# Patient Record
Sex: Female | Born: 1970 | Race: Black or African American | Hispanic: No | Marital: Single | Smoking: Never smoker
Health system: Southern US, Community
[De-identification: ages and names within clinical notes are randomized; demographics above are authoritative.]

## PROBLEM LIST (undated history)

## (undated) DIAGNOSIS — Z85850 Personal history of malignant neoplasm of thyroid: Secondary | ICD-10-CM

## (undated) DIAGNOSIS — D649 Anemia, unspecified: Secondary | ICD-10-CM

## (undated) DIAGNOSIS — Z9289 Personal history of other medical treatment: Secondary | ICD-10-CM

## (undated) DIAGNOSIS — E785 Hyperlipidemia, unspecified: Secondary | ICD-10-CM

## (undated) DIAGNOSIS — C801 Malignant (primary) neoplasm, unspecified: Secondary | ICD-10-CM

## (undated) DIAGNOSIS — C73 Malignant neoplasm of thyroid gland: Secondary | ICD-10-CM

## (undated) DIAGNOSIS — I10 Essential (primary) hypertension: Secondary | ICD-10-CM

## (undated) DIAGNOSIS — N915 Oligomenorrhea, unspecified: Secondary | ICD-10-CM

## (undated) DIAGNOSIS — L309 Dermatitis, unspecified: Secondary | ICD-10-CM

## (undated) DIAGNOSIS — E079 Disorder of thyroid, unspecified: Secondary | ICD-10-CM

## (undated) DIAGNOSIS — N912 Amenorrhea, unspecified: Secondary | ICD-10-CM

## (undated) DIAGNOSIS — E119 Type 2 diabetes mellitus without complications: Secondary | ICD-10-CM

## (undated) HISTORY — DX: Amenorrhea, unspecified: N91.2

## (undated) HISTORY — DX: Malignant neoplasm of thyroid gland: C73

## (undated) HISTORY — DX: Anemia, unspecified: D64.9

## (undated) HISTORY — DX: Hyperlipidemia, unspecified: E78.5

## (undated) HISTORY — DX: Personal history of other medical treatment: Z92.89

## (undated) HISTORY — DX: Dermatitis, unspecified: L30.9

## (undated) HISTORY — DX: Disorder of thyroid, unspecified: E07.9

## (undated) HISTORY — DX: Personal history of malignant neoplasm of thyroid: Z85.850

## (undated) HISTORY — DX: Essential (primary) hypertension: I10

## (undated) HISTORY — DX: Oligomenorrhea, unspecified: N91.5

## (undated) HISTORY — DX: Type 2 diabetes mellitus without complications: E11.9

---

## 1989-03-01 HISTORY — PX: KNEE ARTHROSCOPY: SUR90

## 2007-03-02 DIAGNOSIS — Z85850 Personal history of malignant neoplasm of thyroid: Secondary | ICD-10-CM

## 2007-03-02 HISTORY — DX: Personal history of malignant neoplasm of thyroid: Z85.850

## 2007-03-31 ENCOUNTER — Ambulatory Visit: Payer: Self-pay | Admitting: Family Medicine

## 2007-04-13 ENCOUNTER — Ambulatory Visit: Payer: Self-pay | Admitting: Family Medicine

## 2007-08-30 ENCOUNTER — Ambulatory Visit: Payer: Self-pay | Admitting: Oncology

## 2007-09-04 ENCOUNTER — Other Ambulatory Visit: Payer: Self-pay

## 2007-09-04 ENCOUNTER — Ambulatory Visit: Payer: Self-pay | Admitting: Unknown Physician Specialty

## 2007-09-05 ENCOUNTER — Ambulatory Visit: Payer: Self-pay | Admitting: Unknown Physician Specialty

## 2007-09-05 HISTORY — PX: THYROIDECTOMY: SHX17

## 2007-09-16 DIAGNOSIS — Z9289 Personal history of other medical treatment: Secondary | ICD-10-CM

## 2007-09-16 HISTORY — DX: Personal history of other medical treatment: Z92.89

## 2007-09-19 ENCOUNTER — Ambulatory Visit: Payer: Self-pay | Admitting: Unknown Physician Specialty

## 2007-09-21 ENCOUNTER — Ambulatory Visit: Payer: Self-pay | Admitting: Oncology

## 2007-09-30 ENCOUNTER — Ambulatory Visit: Payer: Self-pay | Admitting: Oncology

## 2007-10-17 ENCOUNTER — Ambulatory Visit: Payer: Self-pay

## 2007-10-17 LAB — HM MAMMOGRAPHY

## 2007-10-31 ENCOUNTER — Ambulatory Visit: Payer: Self-pay | Admitting: Oncology

## 2007-11-30 ENCOUNTER — Ambulatory Visit: Payer: Self-pay | Admitting: Oncology

## 2008-01-30 ENCOUNTER — Ambulatory Visit: Payer: Self-pay | Admitting: Oncology

## 2008-02-19 ENCOUNTER — Ambulatory Visit: Payer: Self-pay | Admitting: Oncology

## 2008-03-01 ENCOUNTER — Ambulatory Visit: Payer: Self-pay | Admitting: Oncology

## 2008-04-29 ENCOUNTER — Ambulatory Visit: Payer: Self-pay | Admitting: Oncology

## 2008-05-21 ENCOUNTER — Ambulatory Visit: Payer: Self-pay | Admitting: Oncology

## 2008-05-30 ENCOUNTER — Ambulatory Visit: Payer: Self-pay | Admitting: Oncology

## 2008-12-30 ENCOUNTER — Ambulatory Visit: Payer: Self-pay | Admitting: Oncology

## 2008-12-31 ENCOUNTER — Ambulatory Visit: Payer: Self-pay | Admitting: Oncology

## 2009-01-29 ENCOUNTER — Ambulatory Visit: Payer: Self-pay | Admitting: Oncology

## 2011-03-19 ENCOUNTER — Ambulatory Visit: Payer: Self-pay | Admitting: Oncology

## 2011-05-06 ENCOUNTER — Ambulatory Visit: Payer: Self-pay | Admitting: Oncology

## 2011-05-06 LAB — T4, FREE: Free Thyroxine: 0.99 ng/dL (ref 0.76–1.46)

## 2011-05-06 LAB — TSH: Thyroid Stimulating Horm: 2.66 u[IU]/mL

## 2011-05-31 ENCOUNTER — Ambulatory Visit: Payer: Self-pay | Admitting: Oncology

## 2012-05-08 DIAGNOSIS — N915 Oligomenorrhea, unspecified: Secondary | ICD-10-CM

## 2012-05-08 HISTORY — DX: Oligomenorrhea, unspecified: N91.5

## 2012-05-08 LAB — HM PAP SMEAR

## 2012-06-07 ENCOUNTER — Ambulatory Visit: Payer: Self-pay | Admitting: Family Medicine

## 2014-09-04 ENCOUNTER — Inpatient Hospital Stay: Payer: BC Managed Care – PPO | Attending: Oncology | Admitting: Oncology

## 2014-09-04 ENCOUNTER — Ambulatory Visit: Payer: BC Managed Care – PPO

## 2014-09-04 ENCOUNTER — Encounter: Payer: Self-pay | Admitting: Oncology

## 2014-09-04 VITALS — BP 147/87 | HR 65 | Temp 95.1°F | Resp 20 | Ht 69.69 in | Wt 345.9 lb

## 2014-09-04 DIAGNOSIS — C73 Malignant neoplasm of thyroid gland: Secondary | ICD-10-CM

## 2014-09-04 DIAGNOSIS — Z79899 Other long term (current) drug therapy: Secondary | ICD-10-CM | POA: Diagnosis not present

## 2014-09-04 DIAGNOSIS — E890 Postprocedural hypothyroidism: Secondary | ICD-10-CM | POA: Diagnosis not present

## 2014-09-04 DIAGNOSIS — Z85850 Personal history of malignant neoplasm of thyroid: Secondary | ICD-10-CM | POA: Diagnosis not present

## 2014-09-04 DIAGNOSIS — I10 Essential (primary) hypertension: Secondary | ICD-10-CM | POA: Insufficient documentation

## 2014-09-04 DIAGNOSIS — L309 Dermatitis, unspecified: Secondary | ICD-10-CM | POA: Diagnosis not present

## 2014-09-04 DIAGNOSIS — E119 Type 2 diabetes mellitus without complications: Secondary | ICD-10-CM | POA: Diagnosis not present

## 2014-09-04 DIAGNOSIS — R232 Flushing: Secondary | ICD-10-CM | POA: Insufficient documentation

## 2014-09-05 LAB — THYROGLOBULIN ANTIBODY

## 2014-09-20 NOTE — Progress Notes (Signed)
[place]  [phone] [phone]) [phone]  ID: Stephanie Page OB: 08-13-1970  MR#: [id number]  ERX#:[id number]  Patient Care Team: Maryland Pink, MD as PCP - General (Family Medicine)  CHIEF COMPLAINT:  Chief Complaint  Patient presents with  . Follow-up    thyroid cancer    INTERVAL HISTORY: Patient has not been evaluated in clinic since March 2013.  She returns today for laboratory work and further clinical evaluation.  Patient's primary care has been managing her Synthroid dose. She continues to feel well and work full time.  She also has occasional hot flashes. She denies any weakness or fatigue.  She denies any weight gain or weight loss. She denies any fevers or chills. She otherwise feels well and offers no further specific complaints.  REVIEW OF SYSTEMS:   Review of Systems  Constitutional: Negative.   HENT: Negative.   Respiratory: Negative.   Cardiovascular: Negative.   Gastrointestinal: Negative.     As per HPI. Otherwise, a complete review of systems is negatve.  PAST MEDICAL HISTORY: Past Medical History  Diagnosis Date  . Hypertension   . Diabetes mellitus without complication   . Hyperlipidemia   . Anemia   . Eczema   . Hx of thyroid cancer     PAST SURGICAL HISTORY: Past Surgical History  Procedure Laterality Date  . Thyroidectomy      FAMILY HISTORY Family History  Problem Relation Age of Onset  . Hypertension Mother        ADVANCED DIRECTIVES:    HEALTH MAINTENANCE: History  Substance Use Topics  . Smoking status: Not on file  . Smokeless tobacco: Not on file  . Alcohol Use: Not on file     Colonoscopy:  PAP:  Bone density:  Lipid panel:  No Known Allergies  Current Outpatient Prescriptions  Medication Sig Dispense Refill  . levothyroxine (SYNTHROID, LEVOTHROID) 150 MCG tablet Take by mouth.    . meloxicam (MOBIC) 15 MG tablet     . metFORMIN (GLUCOPHAGE) 500 MG tablet Take by mouth.    .  triamterene-hydrochlorothiazide (MAXZIDE) 75-50 MG per tablet Take 1 tablet by mouth daily.  1   No current facility-administered medications for this visit.    OBJECTIVE: Filed Vitals:   09/04/14 1134  BP: 147/87  Pulse: 65  Temp: 95.1 F (35.1 C)  Resp: 20     Body mass index is 50.08 kg/(m^2).    ECOG FS:0 - Asymptomatic  General: Well-developed, well-nourished, no acute distress. Eyes: Pink conjunctiva, anicteric sclera. HEENT: Normocephalic, moist mucous membranes, clear oropharnyx. Lungs: Clear to auscultation bilaterally. Heart: Regular rate and rhythm. No rubs, murmurs, or gallops. Abdomen: Soft, nontender, nondistended. No organomegaly noted, normoactive bowel sounds. Musculoskeletal: No edema, cyanosis, or clubbing. Neuro: Alert, answering all questions appropriately. Cranial nerves grossly intact. Skin: No rashes or petechiae noted. Psych: Normal affect.   LAB RESULTS:  No results found for: NA, K, CL, CO2, GLUCOSE, BUN, CREATININE, CALCIUM, PROT, ALBUMIN, AST, ALT, ALKPHOS, BILITOT, GFRNONAA, GFRAA  No results found for: WBC, NEUTROABS, HGB, HCT, MCV, PLT   STUDIES: No results found.  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   PLAN:    1. Thyroid cancer: Repeat TSH and T4 today.  Patient has been instructed to continue her current dose of Synthroid.  Patient's PCP is managing her Synthroid dosing.  Thyroid antithyroglobulin antibody continues to be negative. Patient is now nearly 7 years out from her surgery with no  evidence of disease. No further imaging or follow-up is necessary.    Patient expressed understanding and was in agreement with this plan. She also understands that She can call clinic at any time with any questions, concerns, or complaints.   Lloyd Huger, MD   09/20/2014 4:08 PM

## 2015-08-28 ENCOUNTER — Other Ambulatory Visit: Payer: Self-pay | Admitting: Family Medicine

## 2015-08-28 DIAGNOSIS — Z1231 Encounter for screening mammogram for malignant neoplasm of breast: Secondary | ICD-10-CM

## 2015-09-16 ENCOUNTER — Ambulatory Visit: Payer: BC Managed Care – PPO

## 2015-10-03 ENCOUNTER — Other Ambulatory Visit: Payer: Self-pay | Admitting: Family Medicine

## 2015-10-03 ENCOUNTER — Ambulatory Visit
Admission: RE | Admit: 2015-10-03 | Discharge: 2015-10-03 | Disposition: A | Discharge status: Home / Self Care | Payer: BC Managed Care – PPO | Source: Ambulatory Visit | Attending: Family Medicine | Admitting: Family Medicine

## 2015-10-03 DIAGNOSIS — Z1231 Encounter for screening mammogram for malignant neoplasm of breast: Secondary | ICD-10-CM

## 2015-10-03 HISTORY — DX: Malignant (primary) neoplasm, unspecified: C80.1

## 2016-11-02 ENCOUNTER — Ambulatory Visit: Payer: Self-pay | Admitting: Obstetrics and Gynecology

## 2016-12-15 ENCOUNTER — Encounter: Payer: Self-pay | Admitting: Obstetrics and Gynecology

## 2016-12-15 ENCOUNTER — Ambulatory Visit (INDEPENDENT_AMBULATORY_CARE_PROVIDER_SITE_OTHER): Payer: BC Managed Care – PPO | Admitting: Obstetrics and Gynecology

## 2016-12-15 VITALS — BP 130/80 | HR 84 | Ht 68.0 in | Wt 342.0 lb

## 2016-12-15 DIAGNOSIS — N914 Secondary oligomenorrhea: Secondary | ICD-10-CM | POA: Diagnosis not present

## 2016-12-15 DIAGNOSIS — Z01419 Encounter for gynecological examination (general) (routine) without abnormal findings: Secondary | ICD-10-CM | POA: Diagnosis not present

## 2016-12-15 DIAGNOSIS — Z124 Encounter for screening for malignant neoplasm of cervix: Secondary | ICD-10-CM

## 2016-12-15 DIAGNOSIS — Z1231 Encounter for screening mammogram for malignant neoplasm of breast: Secondary | ICD-10-CM

## 2016-12-15 DIAGNOSIS — Z1151 Encounter for screening for human papillomavirus (HPV): Secondary | ICD-10-CM | POA: Diagnosis not present

## 2016-12-15 DIAGNOSIS — Z1239 Encounter for other screening for malignant neoplasm of breast: Secondary | ICD-10-CM

## 2016-12-15 NOTE — Progress Notes (Signed)
PCP:  Maryland Pink, MD   Chief Complaint  Patient presents with  . Gynecologic Exam     HPI:      Ms. Stephanie Page is a 46 y.o. G1P1001 who LMP was No LMP recorded. Patient is not currently having periods (Reason: Perimenopausal)., presents today for her NP annual examination (last seen 05/08/12).  Her menses are absent for close to a yr but pt may have had a small amt of spotting 2/18. Dysmenorrhea mild, occurring throughout cycle. She does not have intermenstrual bleeding. When last seen 2014, pt was having menses Q2-6 months and was given prometrium to take Q3 months if no spontaneous menses for withdrawal bleed. Pt states she was still taking this last yr with a little spotting afterwards. Her mom went through menopause in mid 72s. Pt states her thyroid is normal on meds.   Sex activity: not sexually active.  Last Pap: May 08, 2012  Results were: no abnormalities /neg HPV DNA  Hx of STDs: none  Last mammogram: October 03, 2015  Results were: normal--routine follow-up in 12 months There is no FH of breast cancer. There is no FH of ovarian cancer. The patient does do self-breast exams.  Tobacco use: The patient denies current or previous tobacco use. Alcohol use: none No drug use.  Exercise: not active  She does get adequate calcium and Vitamin D in her diet.  Labs with PCP.   Past Medical History:  Diagnosis Date  . Amenorrhea   . Anemia   . Cancer ([place])    Thyroid  . Diabetes mellitus without complication ([place])    type 2  . Eczema   . History of mammogram 09/16/2007   neg  . History of Papanicolaou smear of cervix 09/07/07; 05/08/12   -/-; -/-  . Hx of thyroid cancer 2009  . Hyperlipidemia   . Hypertension   . Oligomenorrhea 05/08/2012  . Thyroid disease    acquired hypothyroidism    Past Surgical History:  Procedure Laterality Date  . KNEE ARTHROSCOPY  1991  . THYROIDECTOMY  09/05/2007   module of left thyroid removed    Family History  Problem  Relation Age of Onset  . Hypertension Mother   . Diabetes Maternal Uncle   . Cancer Maternal Grandfather        stomach  . Breast cancer Neg Hx     Social History   Social History  . Marital status: Single    Spouse name: N/A  . Number of children: 1  . Years of education: 29   Occupational History  . TEACHER     [employer]   Social History Main Topics  . Smoking status: Never Smoker  . Smokeless tobacco: Never Used  . Alcohol use Yes  . Drug use: No  . Sexual activity: Not Currently    Birth control/ protection: None   Other Topics Concern  . Not on file   Social History Narrative  . No narrative on file    Current Meds  Medication Sig  . levothyroxine (SYNTHROID, LEVOTHROID) 150 MCG tablet Take 1 tablet by mouth daily. Before breakfast  . meloxicam (MOBIC) 15 MG tablet   . metFORMIN (GLUCOPHAGE) 500 MG tablet Take by mouth.  . pravastatin (PRAVACHOL) 40 MG tablet TAKE ONE TABLET BY MOUTH ONCE DAILY  . triamterene-hydrochlorothiazide (MAXZIDE) 75-50 MG per tablet Take 1 tablet by mouth daily.     ROS:  Review of Systems  Constitutional: Negative for fatigue, fever and unexpected  weight change.  Respiratory: Negative for cough, shortness of breath and wheezing.   Cardiovascular: Negative for chest pain, palpitations and leg swelling.  Gastrointestinal: Negative for blood in stool, constipation, diarrhea, nausea and vomiting.  Endocrine: Negative for cold intolerance, heat intolerance and polyuria.  Genitourinary: Negative for dyspareunia, dysuria, flank pain, frequency, genital sores, hematuria, menstrual problem, pelvic pain, urgency, vaginal bleeding, vaginal discharge and vaginal pain.  Musculoskeletal: Positive for joint swelling. Negative for back pain and myalgias.  Skin: Negative for rash.  Neurological: Negative for dizziness, syncope, light-headedness, numbness and headaches.  Hematological: Negative for adenopathy.  Psychiatric/Behavioral:  Negative for agitation, confusion, sleep disturbance and suicidal ideas. The patient is not nervous/anxious.      Objective: BP 130/80   Pulse 84   Ht 5\' 8"  (1.727 m)   Wt (!) 342 lb (155.1 kg)   BMI 52.00 kg/m    Physical Exam  Constitutional: She is oriented to person, place, and time. She appears well-developed and well-nourished.  Genitourinary: Vagina normal and uterus normal. There is no rash or tenderness on the right labia. There is no rash or tenderness on the left labia. No erythema or tenderness in the vagina. No vaginal discharge found. Right adnexum does not display mass and does not display tenderness. Left adnexum does not display mass and does not display tenderness. Cervix does not exhibit motion tenderness or polyp. Uterus is not enlarged or tender.  Neck: Normal range of motion. No thyromegaly present.  Cardiovascular: Normal rate, regular rhythm and normal heart sounds.   No murmur heard. Pulmonary/Chest: Effort normal and breath sounds normal. Right breast exhibits no mass, no nipple discharge, no skin change and no tenderness. Left breast exhibits no mass, no nipple discharge, no skin change and no tenderness.  Abdominal: Soft. There is no tenderness. There is no guarding.  Musculoskeletal: Normal range of motion.  Neurological: She is alert and oriented to person, place, and time. No cranial nerve deficit.  Psychiatric: She has a normal mood and affect. Her behavior is normal.  Vitals reviewed.   Assessment/Plan: Encounter for annual routine gynecological examination  Cervical cancer screening - Plan: IGP, Aptima HPV  Screening for HPV (human papillomavirus) - Plan: IGP, Aptima HPV  Screening for breast cancer - Pt to sched mammo. - Plan: MM DIGITAL SCREENING BILATERAL  Secondary oligomenorrhea - VS menopause. Check labs. Will f/u with results. May need prog withdrawal.  - Plan: FSH/LH            GYN counsel mammography screening, osteoporosis, adequate  intake of calcium and vitamin D, diet and exercise     F/U  Return in about 1 year (around 12/15/2017).  Deairra Halleck B. Natasa Stigall, PA-C 12/15/2016 5:04 PM

## 2016-12-16 ENCOUNTER — Telehealth: Payer: Self-pay | Admitting: Obstetrics and Gynecology

## 2016-12-16 LAB — FSH/LH
FSH: 8.9 m[IU]/mL
LH: 12 m[IU]/mL

## 2016-12-16 MED ORDER — MEDROXYPROGESTERONE ACETATE 10 MG PO TABS: 10.0000 mg | ORAL_TABLET | Freq: Every day | ORAL | 0 refills | Status: AC

## 2016-12-16 NOTE — Telephone Encounter (Signed)
Pt aware she has NOT gone through menopause per labs. FSH=8.9. Pt amenorrheic but has hx of irregular menses for many yrs. Rx provera Q90 days if no spontaneous menses. F/u prn.

## 2016-12-17 LAB — IGP, APTIMA HPV
HPV Aptima: NEGATIVE
PAP Smear Comment: 0

## 2019-04-23 ENCOUNTER — Other Ambulatory Visit: Payer: Self-pay | Admitting: Obstetrics and Gynecology

## 2020-05-07 ENCOUNTER — Other Ambulatory Visit: Payer: Self-pay | Admitting: Family Medicine

## 2020-05-07 DIAGNOSIS — Z1231 Encounter for screening mammogram for malignant neoplasm of breast: Secondary | ICD-10-CM

## 2020-05-27 ENCOUNTER — Other Ambulatory Visit: Payer: Self-pay

## 2020-05-27 ENCOUNTER — Ambulatory Visit
Admission: RE | Admit: 2020-05-27 | Discharge: 2020-05-27 | Disposition: A | Discharge status: Home / Self Care | Payer: BC Managed Care – PPO | Source: Ambulatory Visit | Attending: Family Medicine | Admitting: Family Medicine

## 2020-05-27 DIAGNOSIS — Z1231 Encounter for screening mammogram for malignant neoplasm of breast: Secondary | ICD-10-CM | POA: Diagnosis not present

## 2020-12-16 ENCOUNTER — Encounter
Admission: RE | Disposition: A | Discharge status: Home / Self Care | Payer: Self-pay | Source: Home / Self Care | Attending: Gastroenterology

## 2020-12-16 ENCOUNTER — Encounter: Payer: Self-pay | Admitting: *Deleted

## 2020-12-16 ENCOUNTER — Ambulatory Visit: Payer: BC Managed Care – PPO | Admitting: Anesthesiology

## 2020-12-16 ENCOUNTER — Ambulatory Visit
Admission: RE | Admit: 2020-12-16 | Discharge: 2020-12-16 | Disposition: A | Discharge status: Home / Self Care | Payer: BC Managed Care – PPO | Attending: Gastroenterology | Admitting: Gastroenterology

## 2020-12-16 ENCOUNTER — Other Ambulatory Visit: Payer: Self-pay

## 2020-12-16 DIAGNOSIS — E119 Type 2 diabetes mellitus without complications: Secondary | ICD-10-CM | POA: Insufficient documentation

## 2020-12-16 DIAGNOSIS — Z79899 Other long term (current) drug therapy: Secondary | ICD-10-CM | POA: Insufficient documentation

## 2020-12-16 DIAGNOSIS — Z79890 Hormone replacement therapy (postmenopausal): Secondary | ICD-10-CM | POA: Diagnosis not present

## 2020-12-16 DIAGNOSIS — Z7984 Long term (current) use of oral hypoglycemic drugs: Secondary | ICD-10-CM | POA: Insufficient documentation

## 2020-12-16 DIAGNOSIS — Z6841 Body Mass Index (BMI) 40.0 and over, adult: Secondary | ICD-10-CM | POA: Diagnosis not present

## 2020-12-16 DIAGNOSIS — Z1211 Encounter for screening for malignant neoplasm of colon: Secondary | ICD-10-CM | POA: Insufficient documentation

## 2020-12-16 DIAGNOSIS — Z85850 Personal history of malignant neoplasm of thyroid: Secondary | ICD-10-CM | POA: Diagnosis not present

## 2020-12-16 DIAGNOSIS — K640 First degree hemorrhoids: Secondary | ICD-10-CM | POA: Insufficient documentation

## 2020-12-16 DIAGNOSIS — Z791 Long term (current) use of non-steroidal anti-inflammatories (NSAID): Secondary | ICD-10-CM | POA: Diagnosis not present

## 2020-12-16 DIAGNOSIS — Z793 Long term (current) use of hormonal contraceptives: Secondary | ICD-10-CM | POA: Insufficient documentation

## 2020-12-16 HISTORY — PX: COLONOSCOPY WITH PROPOFOL: SHX5780

## 2020-12-16 LAB — POCT PREGNANCY, URINE: Preg Test, Ur: NEGATIVE

## 2020-12-16 LAB — GLUCOSE, CAPILLARY: Glucose-Capillary: 110 mg/dL — ABNORMAL HIGH (ref 70–99)

## 2020-12-16 SURGERY — COLONOSCOPY WITH PROPOFOL
Anesthesia: General

## 2020-12-16 MED ORDER — SODIUM CHLORIDE 0.9 % IV SOLN: INTRAVENOUS | Status: DC

## 2020-12-16 MED ORDER — PROPOFOL 10 MG/ML IV BOLUS
INTRAVENOUS | Status: DC | PRN
  Administered 2020-12-16: 50 mg via INTRAVENOUS

## 2020-12-16 MED ORDER — ACETAMINOPHEN 325 MG PO TABS: 650.0000 mg | ORAL_TABLET | Freq: Once | ORAL | Status: DC | PRN

## 2020-12-16 MED ORDER — PHENYLEPHRINE HCL (PRESSORS) 10 MG/ML IV SOLN
INTRAVENOUS | Status: AC
  Filled 2020-12-16: qty 1

## 2020-12-16 MED ORDER — PROPOFOL 500 MG/50ML IV EMUL
INTRAVENOUS | Status: DC | PRN
  Administered 2020-12-16: 120 ug/kg/min via INTRAVENOUS

## 2020-12-16 MED ORDER — ONDANSETRON HCL 4 MG/2ML IJ SOLN: 4.0000 mg | Freq: Once | INTRAMUSCULAR | Status: DC | PRN

## 2020-12-16 MED ORDER — PROPOFOL 500 MG/50ML IV EMUL
INTRAVENOUS | Status: AC
  Filled 2020-12-16: qty 50

## 2020-12-16 MED ORDER — ACETAMINOPHEN 160 MG/5ML PO SOLN
325.0000 mg | ORAL | Status: DC | PRN
  Filled 2020-12-16: qty 20.3

## 2020-12-16 NOTE — Anesthesia Preprocedure Evaluation (Signed)
Anesthesia Evaluation  Patient identified by MRN, date of birth, ID band Patient awake    Reviewed: Allergy & Precautions, NPO status , Patient's Chart, lab work & pertinent test results  History of Anesthesia Complications Negative for: history of anesthetic complications  Airway Mallampati: I   Neck ROM: Full    Dental  (+)    Pulmonary neg pulmonary ROS,    Pulmonary exam normal breath sounds clear to auscultation       Cardiovascular hypertension, Normal cardiovascular exam Rhythm:Regular Rate:Normal     Neuro/Psych negative neurological ROS     GI/Hepatic negative GI ROS,   Endo/Other  diabetes, Type 2Hypothyroidism (thyroid CA s/p thyroidectomy) Class 3 obesity  Renal/GU negative Renal ROS     Musculoskeletal   Abdominal   Peds  Hematology  (+) Blood dyscrasia, anemia ,   Anesthesia Other Findings   Reproductive/Obstetrics                             Anesthesia Physical Anesthesia Plan  ASA: 3  Anesthesia Plan: General   Post-op Pain Management:    Induction: Intravenous  PONV Risk Score and Plan: 3 and Propofol infusion, TIVA and Treatment may vary due to age or medical condition  Airway Management Planned: Natural Airway  Additional Equipment:   Intra-op Plan:   Post-operative Plan:   Informed Consent: I have reviewed the patients History and Physical, chart, labs and discussed the procedure including the risks, benefits and alternatives for the proposed anesthesia with the patient or authorized representative who has indicated his/her understanding and acceptance.       Plan Discussed with: CRNA  Anesthesia Plan Comments:         Anesthesia Quick Evaluation

## 2020-12-16 NOTE — Transfer of Care (Signed)
Immediate Anesthesia Transfer of Care Note  Patient: Declynn Lopresti  Procedure(s) Performed: COLONOSCOPY WITH PROPOFOL  Patient Location: PACU  Anesthesia Type:General  Level of Consciousness: awake, alert  and oriented  Airway & Oxygen Therapy: Patient Spontanous Breathing and Patient connected to face mask oxygen  Post-op Assessment: Report given to RN and Post -op Vital signs reviewed and stable  Post vital signs: Reviewed and stable  Last Vitals:  Vitals Value Taken Time  BP 116/104 12/16/20 0820  Temp 35.6 C 12/16/20 0820  Pulse 80 12/16/20 0822  Resp 14 12/16/20 0822  SpO2 100 % 12/16/20 0822  Vitals shown include unvalidated device data.  Last Pain:  Vitals:   12/16/20 0734  TempSrc: Temporal  PainSc: 0-No pain         Complications: No notable events documented.

## 2020-12-16 NOTE — Anesthesia Postprocedure Evaluation (Signed)
Anesthesia Post Note  Patient: Stephanie Page  Procedure(s) Performed: COLONOSCOPY WITH PROPOFOL  Patient location during evaluation: PACU Anesthesia Type: General Level of consciousness: awake and alert, oriented and patient cooperative Pain management: pain level controlled Vital Signs Assessment: post-procedure vital signs reviewed and stable Respiratory status: spontaneous breathing, nonlabored ventilation and respiratory function stable Cardiovascular status: blood pressure returned to baseline and stable Postop Assessment: adequate PO intake Anesthetic complications: no   No notable events documented.   Last Vitals:  Vitals:   12/16/20 0840 12/16/20 0850  BP: (!) 128/111 119/62  Pulse: 70 65  Resp: 14 18  Temp:    SpO2: 99% 99%    Last Pain:  Vitals:   12/16/20 0734  TempSrc: Temporal  PainSc: 0-No pain                 Darrin Nipper

## 2020-12-16 NOTE — Op Note (Signed)
[facility] Patient Name: Stephanie Page Procedure Date: 12/16/2020 7:55 AM MRN: [id number] Account #: [id number] Date of Birth: 06/21/1970 Admit Type: Outpatient Age: 50 Room: [facility] ENDO ROOM 1 Gender: Female Note Status: Finalized Instrument Name: Jasper Riling [id number] Procedure:             Colonoscopy Indications:           Screening for colorectal malignant neoplasm Providers:             Andrey Farmer MD, MD Medicines:             Monitored Anesthesia Care Complications:         No immediate complications. Procedure:             Pre-Anesthesia Assessment:                        - Prior to the procedure, a History and Physical was                         performed, and patient medications and allergies were                         reviewed. The patient is competent. The risks and                         benefits of the procedure and the sedation options and                         risks were discussed with the patient. All questions                         were answered and informed consent was obtained.                         Patient identification and proposed procedure were                         verified by the physician, the nurse, the anesthetist                         and the technician in the endoscopy suite. Mental                         Status Examination: alert and oriented. Airway                         Examination: normal oropharyngeal airway and neck                         mobility. Respiratory Examination: clear to                         auscultation. CV Examination: normal. Prophylactic                         Antibiotics: The patient does not require prophylactic                         antibiotics. Prior Anticoagulants: The patient has  taken no previous anticoagulant or antiplatelet                         agents. ASA Grade Assessment: II - A patient with mild                          systemic disease. After reviewing the risks and                         benefits, the patient was deemed in satisfactory                         condition to undergo the procedure. The anesthesia                         plan was to use monitored anesthesia care (MAC).                         Immediately prior to administration of medications,                         the patient was re-assessed for adequacy to receive                         sedatives. The heart rate, respiratory rate, oxygen                         saturations, blood pressure, adequacy of pulmonary                         ventilation, and response to care were monitored                         throughout the procedure. The physical status of the                         patient was re-assessed after the procedure.                        After obtaining informed consent, the colonoscope was                         passed under direct vision. Throughout the procedure,                         the patient's blood pressure, pulse, and oxygen                         saturations were monitored continuously. The                         Colonoscope was introduced through the anus and                         advanced to the the cecum, identified by appendiceal                         orifice and ileocecal valve. The colonoscopy was  somewhat difficult due to significant looping.                         Successful completion of the procedure was aided by                         applying abdominal pressure. The patient tolerated the                         procedure well. The quality of the bowel preparation                         was good. Findings:      The perianal and digital rectal examinations were normal.      Internal hemorrhoids were found during retroflexion. The hemorrhoids       were Grade I (internal hemorrhoids that do not prolapse).      The exam was otherwise without abnormality on direct and  retroflexion       views. Impression:            - Internal hemorrhoids.                        - The examination was otherwise normal on direct and                         retroflexion views.                        - No specimens collected. Recommendation:        - Discharge patient to home.                        - Resume previous diet.                        - Continue present medications.                        - Repeat colonoscopy in 10 years for screening                         purposes.                        - Return to referring physician as previously                         scheduled. Procedure Code(s):     --- Professional ---                        Z6109, Colorectal cancer screening; colonoscopy on                         individual not meeting criteria for high risk Diagnosis Code(s):     --- Professional ---                        Z12.11, Encounter for screening for malignant neoplasm  of colon                        K64.0, First degree hemorrhoids CPT copyright 2019 American Medical Association. All rights reserved. The codes documented in this report are preliminary and upon coder review may  be revised to meet current compliance requirements. Andrey Farmer MD, MD 12/16/2020 8:19:00 AM Number of Addenda: 0 Note Initiated On: 12/16/2020 7:55 AM Scope Withdrawal Time: 0 hours 5 minutes 46 seconds  Total Procedure Duration: 0 hours 12 minutes 36 seconds  Estimated Blood Loss:  Estimated blood loss: none.      [facility]

## 2020-12-16 NOTE — H&P (Signed)
Outpatient short stay form Pre-procedure 12/16/2020  Lesly Rubenstein, MD  Primary Physician: [place] Pink, MD  Reason for visit:  Screening Colon  History of present illness:   49 y/o lady with history of hypothyroidism here for screening colonoscopy. Grandfather maybe had colon cancer. No blood thinners. No significant abdominal surgeries. No new symptoms.    Current Facility-Administered Medications:    0.9 %  sodium chloride infusion, , Intravenous, Continuous, Arlan Birks, Hilton Cork, MD   acetaminophen (TYLENOL) tablet 650 mg, 650 mg, Oral, Once PRN **OR** acetaminophen (TYLENOL) 160 MG/5ML solution 325-650 mg, 325-650 mg, Oral, Q4H PRN, Darrin Nipper, MD   ondansetron [facility]) injection 4 mg, 4 mg, Intravenous, Once PRN, Darrin Nipper, MD  Medications Prior to Admission  Medication Sig Dispense Refill Last Dose   furosemide (LASIX) 20 MG tablet    Past Week   meloxicam (MOBIC) 15 MG tablet    Past Week   metFORMIN (GLUCOPHAGE) 500 MG tablet Take by mouth.   Past Week   pravastatin (PRAVACHOL) 40 MG tablet TAKE ONE TABLET BY MOUTH ONCE DAILY   Past Week   levothyroxine (SYNTHROID, LEVOTHROID) 150 MCG tablet Take 1 tablet by mouth daily. Before breakfast   12/14/2020   medroxyPROGESTERone (PROVERA) 10 MG tablet Take 1 tablet (10 mg total) by mouth daily. For 7 nights, Q90 days if no spontaneous menses 28 tablet 0    progesterone (PROMETRIUM) 200 MG capsule Take 400 mg by mouth daily. In the evening for 10d prn for menses every 84m (Patient not taking: Reported on 12/16/2020)   Not Taking   triamterene-hydrochlorothiazide (MAXZIDE) 75-50 MG per tablet Take 1 tablet by mouth daily.  1 12/14/2020     No Known Allergies   Past Medical History:  Diagnosis Date   Amenorrhea    Anemia    Cancer ([place])    Thyroid   Diabetes mellitus without complication ([place])    type 2   Eczema    History of mammogram 09/16/2007   neg   History of Papanicolaou smear of cervix 09/07/07;  05/08/12   -/-; -/-   Hx of thyroid cancer 2009   Hyperlipidemia    Hypertension    Oligomenorrhea 05/08/2012   Thyroid disease    acquired hypothyroidism    Review of systems:  Otherwise negative.    Physical Exam  Gen: Alert, oriented. Appears stated age.  HEENT: PERRLA. Lungs: No respiratory distress CV: RRR Abd: soft, benign, no masses Ext: No edema    Planned procedures: Proceed with colonoscopy. The patient understands the nature of the planned procedure, indications, risks, alternatives and potential complications including but not limited to bleeding, infection, perforation, damage to internal organs and possible oversedation/side effects from anesthesia. The patient agrees and gives consent to proceed.  Please refer to procedure notes for findings, recommendations and patient disposition/instructions.     Lesly Rubenstein, MD [facility]

## 2020-12-16 NOTE — Interval H&P Note (Signed)
History and Physical Interval Note:  12/16/2020 7:53 AM  Stephanie Page  has presented today for surgery, with the diagnosis of COLON CANCER SCREENING.  The various methods of treatment have been discussed with the patient and family. After consideration of risks, benefits and other options for treatment, the patient has consented to  Procedure(s) with comments: COLONOSCOPY WITH PROPOFOL (N/A) - DM as a surgical intervention.  The patient's history has been reviewed, patient examined, no change in status, stable for surgery.  I have reviewed the patient's chart and labs.  Questions were answered to the patient's satisfaction.     Lesly Rubenstein  Ok to proceed with colonoscopy

## 2020-12-17 ENCOUNTER — Encounter: Payer: Self-pay | Admitting: Gastroenterology

## 2021-02-13 ENCOUNTER — Telehealth: Payer: Self-pay

## 2021-02-13 NOTE — Telephone Encounter (Signed)
Spoke w/patient. Advised GYN or PCP can see her for breast exam. However, she has not been seen here since 11/2016 and would be considered a new patient. Our office is not currently accepting new patients. Advised to contact PCP for appointment.

## 2021-02-13 NOTE — Telephone Encounter (Signed)
Patient has questions about appointment for breast exam. Unsure if she needs to see GYN or PCP. QQ#[phone]

## 2021-02-18 ENCOUNTER — Other Ambulatory Visit: Payer: Self-pay | Admitting: Family Medicine

## 2021-02-18 DIAGNOSIS — N6315 Unspecified lump in the right breast, overlapping quadrants: Secondary | ICD-10-CM

## 2021-10-15 ENCOUNTER — Other Ambulatory Visit: Payer: Self-pay | Admitting: Family Medicine

## 2021-10-15 ENCOUNTER — Ambulatory Visit
Admission: RE | Admit: 2021-10-15 | Discharge: 2021-10-15 | Disposition: A | Discharge status: Home / Self Care | Payer: BC Managed Care – PPO | Source: Ambulatory Visit | Attending: Family Medicine | Admitting: Family Medicine

## 2021-10-15 DIAGNOSIS — N6315 Unspecified lump in the right breast, overlapping quadrants: Secondary | ICD-10-CM | POA: Diagnosis present

## 2021-10-15 DIAGNOSIS — N6320 Unspecified lump in the left breast, unspecified quadrant: Secondary | ICD-10-CM | POA: Diagnosis present

## 2021-12-10 ENCOUNTER — Telehealth: Payer: Self-pay

## 2021-12-10 NOTE — Telephone Encounter (Signed)
Called pt to confirm appointment for tomorrow.  Left message for pt to call and confirm appointment by 1500 10/16

## 2021-12-15 ENCOUNTER — Other Ambulatory Visit (HOSPITAL_COMMUNITY)
Admission: RE | Admit: 2021-12-15 | Discharge: 2021-12-15 | Disposition: A | Discharge status: Home / Self Care | Payer: BC Managed Care – PPO | Source: Ambulatory Visit | Attending: Obstetrics & Gynecology | Admitting: Obstetrics & Gynecology

## 2021-12-15 ENCOUNTER — Ambulatory Visit: Payer: BC Managed Care – PPO | Admitting: Obstetrics & Gynecology

## 2021-12-15 ENCOUNTER — Encounter: Payer: Self-pay | Admitting: Obstetrics & Gynecology

## 2021-12-15 VITALS — BP 152/84 | HR 93 | Ht 69.0 in | Wt 331.0 lb

## 2021-12-15 DIAGNOSIS — Z01419 Encounter for gynecological examination (general) (routine) without abnormal findings: Secondary | ICD-10-CM | POA: Diagnosis not present

## 2021-12-15 DIAGNOSIS — C73 Malignant neoplasm of thyroid gland: Secondary | ICD-10-CM | POA: Insufficient documentation

## 2021-12-15 DIAGNOSIS — E119 Type 2 diabetes mellitus without complications: Secondary | ICD-10-CM | POA: Insufficient documentation

## 2021-12-15 DIAGNOSIS — L309 Dermatitis, unspecified: Secondary | ICD-10-CM | POA: Insufficient documentation

## 2021-12-15 NOTE — Progress Notes (Signed)
GYNECOLOGY ANNUAL PREVENTATIVE CARE ENCOUNTER NOTE  History:     Stephanie Page is a 51 y.o. G18P1001 female here for a routine annual gynecologic exam and to establish care.  Current complaints: none.   Not currently sexually active. Denies abnormal vaginal bleeding, discharge, pelvic pain or other gynecologic concerns.    Gynecologic History No LMP recorded. Patient is postmenopausal. Last Pap: 2018. Result was normal with negative HPV Last Mammogram: 10/15/2021.  Result was benign Last Colonoscopy: 12/16/2020.  Result was benign  Obstetric History OB History  Gravida Para Term Preterm AB Living  '1 1 1     1  '$ SAB IAB Ectopic Multiple Live Births          1    # Outcome Date GA Lbr Len/2nd Weight Sex Delivery Anes PTL Lv  1 Term 03/19/89   7 lb 8 oz (3.402 kg) F Vag-Spont   LIV    Past Medical History:  Diagnosis Date   Amenorrhea    Anemia    Diabetes mellitus without complication ([place])    type 2   Eczema    History of mammogram 09/16/2007   neg   History of Papanicolaou smear of cervix 09/07/07; 05/08/12   -/-; -/-   Hx of thyroid cancer 2009   Hyperlipidemia    Hypertension    Oligomenorrhea 05/08/2012   Thyroid cancer ([place])    Thyroid disease    acquired hypothyroidism    Past Surgical History:  Procedure Laterality Date   COLONOSCOPY WITH PROPOFOL N/A 12/16/2020   Procedure: COLONOSCOPY WITH PROPOFOL;  Surgeon: Lesly Rubenstein, MD;  Location: [facility] ENDOSCOPY;  Service: Endoscopy;  Laterality: N/A;  DM   KNEE ARTHROSCOPY  1991   THYROIDECTOMY  09/05/2007   module of left thyroid removed    Current Outpatient Medications on File Prior to Visit  Medication Sig Dispense Refill   Dulaglutide (TRULICITY) 7.40 CX/4.4YJ SOPN Inject into the skin.     furosemide (LASIX) 20 MG tablet      levothyroxine (SYNTHROID, LEVOTHROID) 150 MCG tablet Take 1 tablet by mouth daily. Before breakfast     triamterene-hydrochlorothiazide (MAXZIDE) 75-50 MG per tablet Take 1  tablet by mouth daily.  1   medroxyPROGESTERone (PROVERA) 10 MG tablet Take 1 tablet (10 mg total) by mouth daily. For 7 nights, Q90 days if no spontaneous menses 28 tablet 0   metFORMIN (GLUCOPHAGE) 500 MG tablet Take by mouth. (Patient not taking: Reported on 12/15/2021)     pravastatin (PRAVACHOL) 40 MG tablet TAKE ONE TABLET BY MOUTH ONCE DAILY (Patient not taking: Reported on 12/15/2021)     No current facility-administered medications on file prior to visit.    No Known Allergies  Social History:  reports that she has never smoked. She has never used smokeless tobacco. She reports current alcohol use. She reports that she does not use drugs.  Family History  Problem Relation Age of Onset   Hypertension Mother    Diabetes Maternal Uncle    Cancer Maternal Grandfather        stomach   Breast cancer Neg Hx     The following portions of the patient's history were reviewed and updated as appropriate: allergies, current medications, past family history, past medical history, past social history, past surgical history and problem list.  Review of Systems Pertinent items noted in HPI and remainder of comprehensive ROS otherwise negative.  Physical Exam:  BP (!) 152/84   Pulse 93   Ht 5'  9" (1.753 m)   Wt (!) 331 lb (150.1 kg)   BMI 48.88 kg/m  CONSTITUTIONAL: Well-developed, well-nourished female in no acute distress.  HENT:  Normocephalic, atraumatic, External right and left ear normal.  EYES: Conjunctivae and EOM are normal. Pupils are equal, round, and reactive to light. No scleral icterus.  NECK: Normal range of motion, supple, no masses.  Normal thyroid.  SKIN: Skin is warm and dry. No rash noted. Not diaphoretic. No erythema. No pallor. MUSCULOSKELETAL: Normal range of motion. No tenderness.  No cyanosis, clubbing, or edema.   NEUROLOGIC: Alert and oriented to person, place, and time. Normal reflexes, muscle tone coordination. No cranial nerve deficit noted. PSYCHIATRIC:  Normal mood and affect. Normal behavior. Normal judgment and thought content. CARDIOVASCULAR: Normal heart rate noted, regular rhythm RESPIRATORY: Clear to auscultation bilaterally. Effort and breath sounds normal, no problems with respiration noted. BREASTS: Symmetric in size. No masses, tenderness, skin changes, nipple drainage, or lymphadenopathy bilaterally.  Performed in the presence of a chaperone. ABDOMEN: Soft, obese, no distention appreciated.  No tenderness, rebound or guarding.  PELVIC: Normal appearing external genitalia and urethral meatus; normal appearing vaginal mucosa and cervix.  Normal appearing discharge.  Pap smear obtained.  Unable to palpate uterus or adnexa secondary to habitus.  Performed in the presence of a chaperone.   Assessment and Plan:    1. Well woman exam with routine gynecological exam - Cytology - PAP Will follow up results of pap smear and manage accordingly. Mammogram and colon cancer screening are up to date. Follow up with PCP for other medical issues. Routine preventative health maintenance measures emphasized. Please refer to After Visit Summary for other counseling recommendations.      Verita Schneiders, MD, [place] for [employer], [place]

## 2021-12-17 LAB — CYTOLOGY - PAP
Comment: NEGATIVE
Diagnosis: NEGATIVE
High risk HPV: NEGATIVE

## 2022-08-05 IMAGING — MG MM DIGITAL SCREENING BILAT W/ TOMO AND CAD
8 of 15 series · 8 of 40 positions shown · non-contrast
Comparison: Previous exam(s).

CLINICAL DATA: Screening.

EXAM:
DIGITAL SCREENING BILATERAL MAMMOGRAM WITH TOMOSYNTHESIS AND CAD
TECHNIQUE: Bilateral screening digital craniocaudal and mediolateral oblique
mammograms were obtained. Bilateral screening digital breast
tomosynthesis was performed. The images were evaluated with
computer-aided detection.

[L CC synth-2D (1 of 2)]
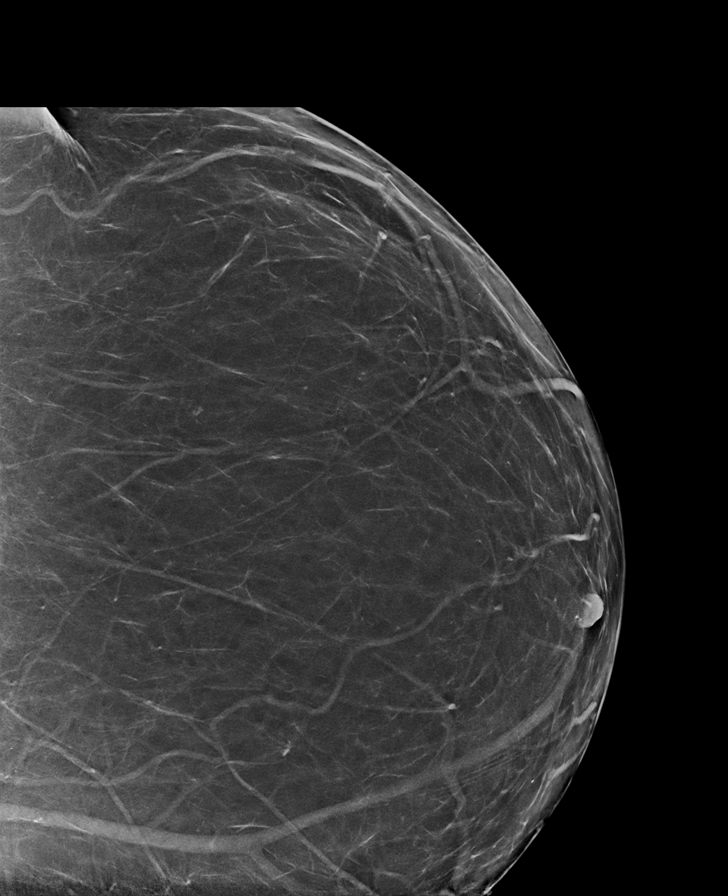

[L MLO synth-2D (1 of 2)]
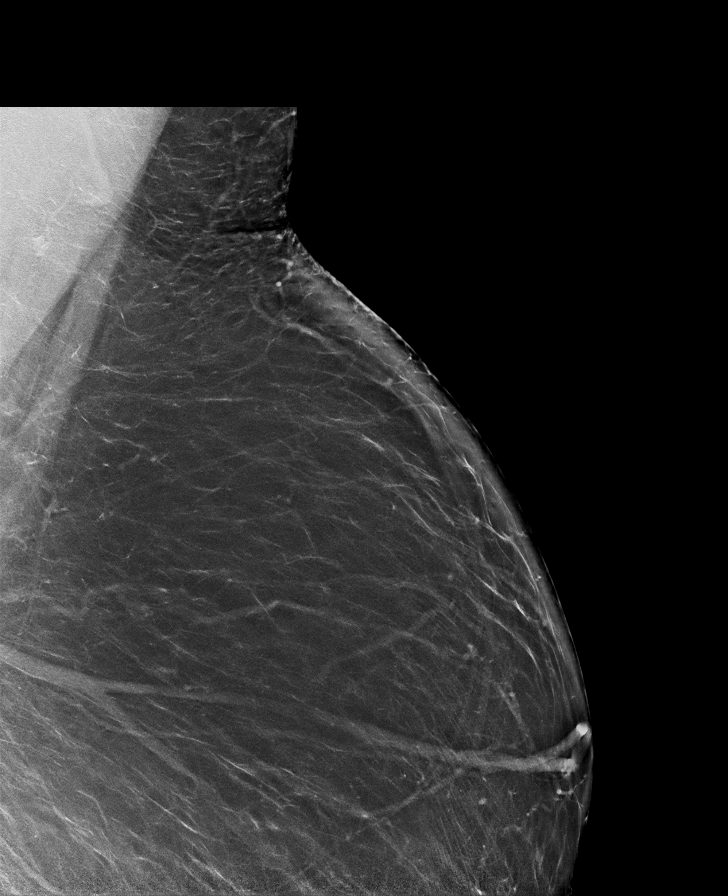

[R CC synth-2D]
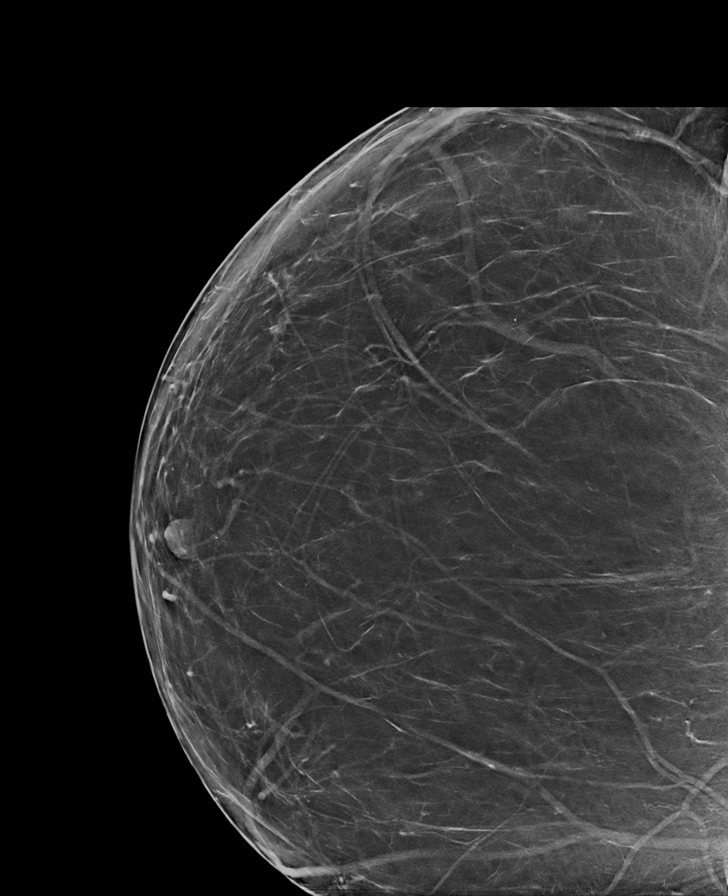

[R MLO synth-2D (1 of 2)]
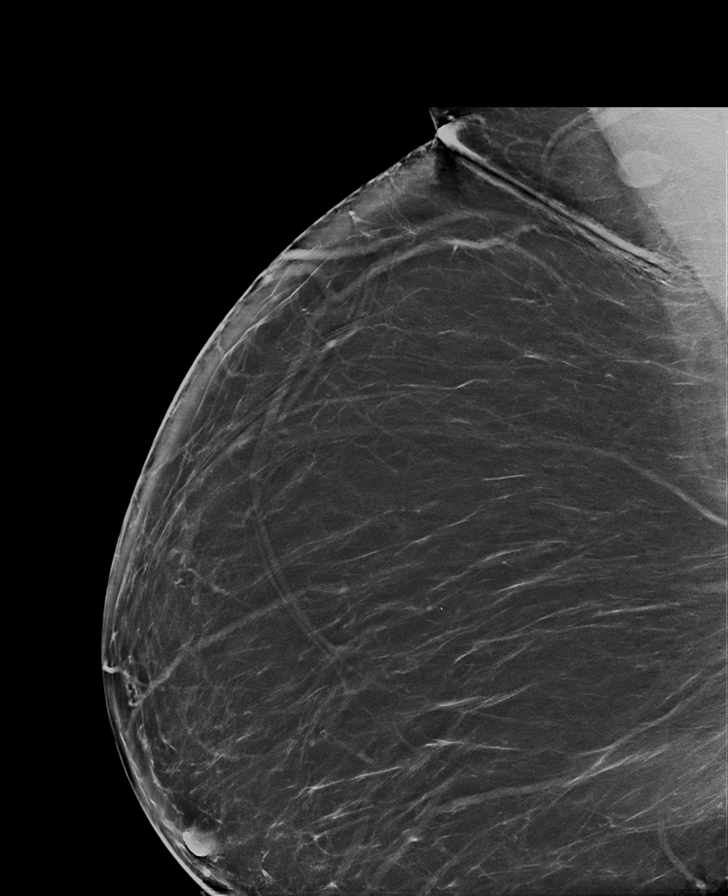

[L MLO synth-2D (2 of 2)]
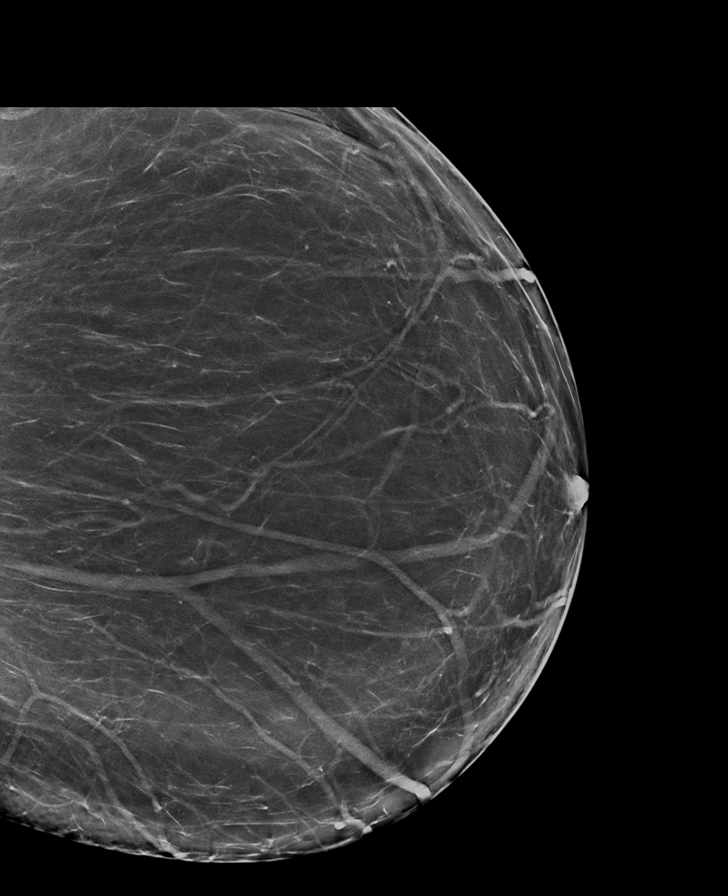

[L CC synth-2D (2 of 2)]
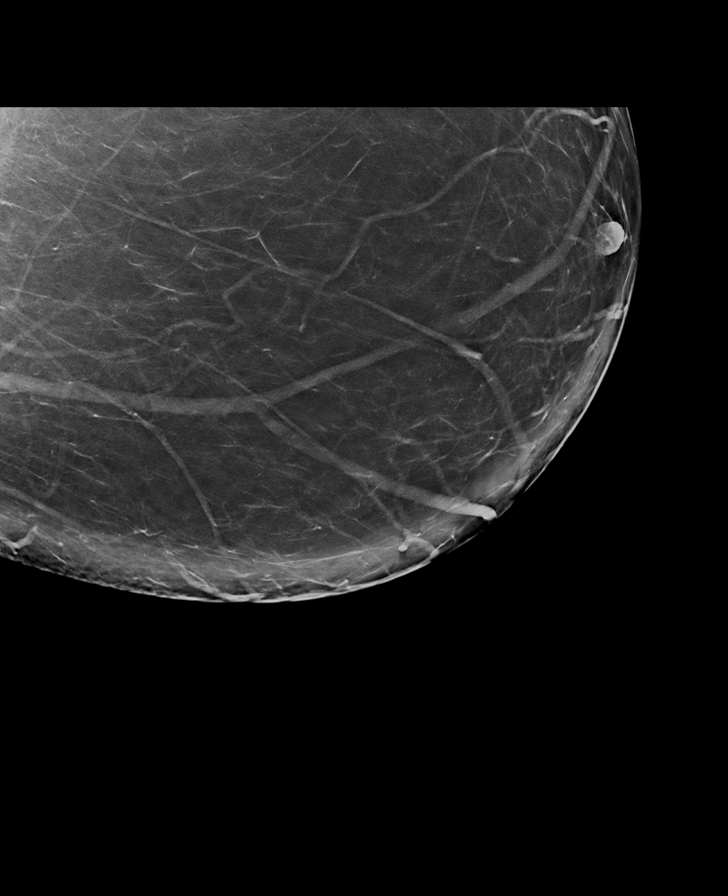

[R MLO synth-2D (2 of 2)]
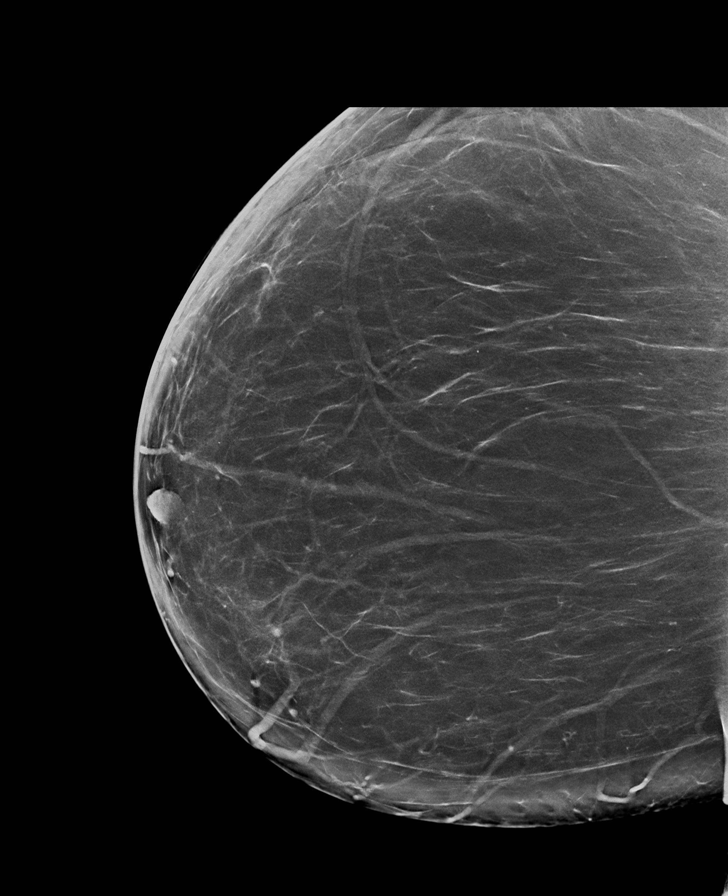

[L MLO tomo · tomo slice 76/112.0]
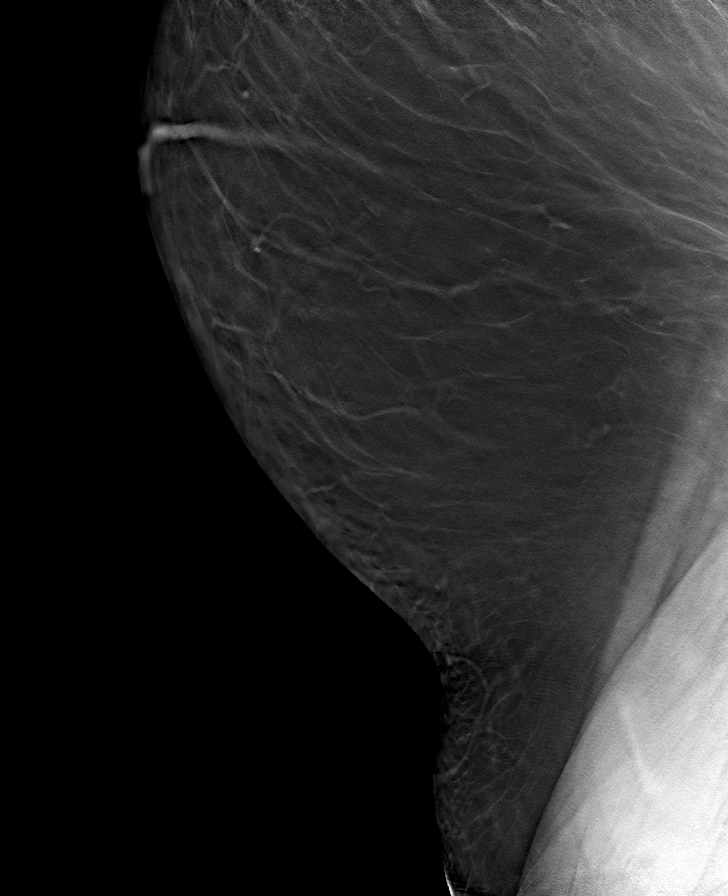

[8 of 40 positions shown; findings below may reference images not displayed]

ACR Breast Density Category b: There are scattered areas of
fibroglandular density.
FINDINGS: There are no findings suspicious for malignancy. The images were
evaluated with computer-aided detection.
IMPRESSION: No mammographic evidence of malignancy. A result letter of this
screening mammogram will be mailed directly to the patient.

RECOMMENDATION:
Screening mammogram in one year. (Code:WJ-I-BG6)

BI-RADS CATEGORY  1: Negative.

## 2023-06-24 ENCOUNTER — Other Ambulatory Visit: Payer: Self-pay | Admitting: Family Medicine

## 2023-06-24 DIAGNOSIS — Z1231 Encounter for screening mammogram for malignant neoplasm of breast: Secondary | ICD-10-CM
